# Patient Record
Sex: Female | Born: 1990 | Race: White | Hispanic: No | Marital: Single | Smoking: Never smoker
Health system: Southern US, Community
[De-identification: ages and names within clinical notes are randomized; demographics above are authoritative.]

## PROBLEM LIST (undated history)

## (undated) DIAGNOSIS — J353 Hypertrophy of tonsils with hypertrophy of adenoids: Secondary | ICD-10-CM

## (undated) DIAGNOSIS — T7840XA Allergy, unspecified, initial encounter: Secondary | ICD-10-CM

## (undated) DIAGNOSIS — Z9889 Other specified postprocedural states: Secondary | ICD-10-CM

## (undated) HISTORY — DX: Allergy, unspecified, initial encounter: T78.40XA

---
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
## 2012-04-05 ENCOUNTER — Encounter (HOSPITAL_COMMUNITY): Payer: Self-pay | Admitting: Emergency Medicine

## 2012-04-05 ENCOUNTER — Emergency Department (HOSPITAL_COMMUNITY)
Admission: EM | Admit: 2012-04-05 | Discharge: 2012-04-05 | Disposition: A | Discharge status: Home / Self Care | Payer: 59 | Attending: Emergency Medicine | Admitting: Emergency Medicine

## 2012-04-05 ENCOUNTER — Emergency Department (HOSPITAL_COMMUNITY): Payer: 59

## 2012-04-05 DIAGNOSIS — R112 Nausea with vomiting, unspecified: Secondary | ICD-10-CM | POA: Insufficient documentation

## 2012-04-05 DIAGNOSIS — J189 Pneumonia, unspecified organism: Secondary | ICD-10-CM

## 2012-04-05 DIAGNOSIS — R509 Fever, unspecified: Secondary | ICD-10-CM | POA: Insufficient documentation

## 2012-04-05 DIAGNOSIS — Z8639 Personal history of other endocrine, nutritional and metabolic disease: Secondary | ICD-10-CM | POA: Insufficient documentation

## 2012-04-05 DIAGNOSIS — IMO0001 Reserved for inherently not codable concepts without codable children: Secondary | ICD-10-CM | POA: Insufficient documentation

## 2012-04-05 DIAGNOSIS — R300 Dysuria: Secondary | ICD-10-CM | POA: Insufficient documentation

## 2012-04-05 DIAGNOSIS — Z8739 Personal history of other diseases of the musculoskeletal system and connective tissue: Secondary | ICD-10-CM | POA: Insufficient documentation

## 2012-04-05 DIAGNOSIS — R5381 Other malaise: Secondary | ICD-10-CM | POA: Insufficient documentation

## 2012-04-05 DIAGNOSIS — M549 Dorsalgia, unspecified: Secondary | ICD-10-CM | POA: Insufficient documentation

## 2012-04-05 DIAGNOSIS — R059 Cough, unspecified: Secondary | ICD-10-CM | POA: Insufficient documentation

## 2012-04-05 DIAGNOSIS — J159 Unspecified bacterial pneumonia: Secondary | ICD-10-CM | POA: Insufficient documentation

## 2012-04-05 DIAGNOSIS — R51 Headache: Secondary | ICD-10-CM | POA: Insufficient documentation

## 2012-04-05 DIAGNOSIS — R0602 Shortness of breath: Secondary | ICD-10-CM | POA: Insufficient documentation

## 2012-04-05 DIAGNOSIS — R109 Unspecified abdominal pain: Secondary | ICD-10-CM | POA: Insufficient documentation

## 2012-04-05 DIAGNOSIS — R05 Cough: Secondary | ICD-10-CM | POA: Insufficient documentation

## 2012-04-05 DIAGNOSIS — N390 Urinary tract infection, site not specified: Secondary | ICD-10-CM

## 2012-04-05 DIAGNOSIS — Z862 Personal history of diseases of the blood and blood-forming organs and certain disorders involving the immune mechanism: Secondary | ICD-10-CM | POA: Insufficient documentation

## 2012-04-05 LAB — CBC WITH DIFFERENTIAL/PLATELET
Hemoglobin: 14.5 g/dL (ref 12.0–15.0)
Lymphs Abs: 1.1 10*3/uL (ref 0.7–4.0)
Monocytes Relative: 11 % (ref 3–12)
Neutro Abs: 6.5 10*3/uL (ref 1.7–7.7)
Neutrophils Relative %: 76 % (ref 43–77)
RBC: 4.94 MIL/uL (ref 3.87–5.11)

## 2012-04-05 LAB — BASIC METABOLIC PANEL
BUN: 8 mg/dL (ref 6–23)
CO2: 22 mEq/L (ref 19–32)
Chloride: 96 mEq/L (ref 96–112)
Glucose, Bld: 90 mg/dL (ref 70–99)
Potassium: 3.4 mEq/L — ABNORMAL LOW (ref 3.5–5.1)

## 2012-04-05 LAB — URINALYSIS, ROUTINE W REFLEX MICROSCOPIC
Bilirubin Urine: NEGATIVE
Nitrite: NEGATIVE
Specific Gravity, Urine: 1.024 (ref 1.005–1.030)
pH: 7 (ref 5.0–8.0)

## 2012-04-05 LAB — URINE MICROSCOPIC-ADD ON

## 2012-04-05 LAB — RAPID STREP SCREEN (MED CTR MEBANE ONLY): Streptococcus, Group A Screen (Direct): NEGATIVE

## 2012-04-05 LAB — POCT PREGNANCY, URINE: Preg Test, Ur: NEGATIVE

## 2012-04-05 MED ORDER — AZITHROMYCIN 250 MG PO TABS
500.0000 mg | ORAL_TABLET | Freq: Once | ORAL | Status: AC
  Administered 2012-04-05: 500 mg via ORAL
  Filled 2012-04-05: qty 2

## 2012-04-05 MED ORDER — MORPHINE SULFATE 4 MG/ML IJ SOLN
4.0000 mg | Freq: Once | INTRAMUSCULAR | Status: AC
  Administered 2012-04-05: 4 mg via INTRAVENOUS
  Filled 2012-04-05: qty 1

## 2012-04-05 MED ORDER — ONDANSETRON HCL 4 MG/2ML IJ SOLN
4.0000 mg | Freq: Once | INTRAMUSCULAR | Status: AC
  Administered 2012-04-05: 4 mg via INTRAVENOUS
  Filled 2012-04-05: qty 2

## 2012-04-05 MED ORDER — SODIUM CHLORIDE 0.9 % IV BOLUS (SEPSIS)
1000.0000 mL | Freq: Once | INTRAVENOUS | Status: AC
  Administered 2012-04-05: 1000 mL via INTRAVENOUS

## 2012-04-05 MED ORDER — AZITHROMYCIN 250 MG PO TABS: ORAL_TABLET | ORAL | Status: DC

## 2012-04-05 MED ORDER — ACETAMINOPHEN 325 MG PO TABS
650.0000 mg | ORAL_TABLET | Freq: Once | ORAL | Status: AC
  Administered 2012-04-05: 650 mg via ORAL
  Filled 2012-04-05: qty 2

## 2012-04-05 MED ORDER — DEXTROSE 5 % IV SOLN
1.0000 g | Freq: Once | INTRAVENOUS | Status: AC
  Administered 2012-04-05: 1 g via INTRAVENOUS
  Filled 2012-04-05: qty 10

## 2012-04-05 MED ORDER — SULFAMETHOXAZOLE-TRIMETHOPRIM 800-160 MG PO TABS: 1.0000 | ORAL_TABLET | Freq: Two times a day (BID) | ORAL | Status: DC

## 2012-04-05 NOTE — ED Notes (Signed)
Pt more relaxed, breathing controlled. RR 20. 100% RA. Pt is a x 4. C/o neck, upper back pain. Pt has chills. Pt has mask in place. EDP made aware of patient.

## 2012-04-05 NOTE — ED Notes (Signed)
Pt here hyperventilating c/o neck stiffness and back pain; pt sts hx of similar 1 year ago that was ruled out meningitis; pt sts some SOB and tachypnea

## 2012-04-05 NOTE — ED Provider Notes (Signed)
History     CSN: [id number]  Arrival date & time 04/05/12  1804   First MD Initiated Contact with Patient 04/05/12 1842      Chief Complaint  Patient presents with  . Neck Pain  . Back Pain    Patient is a 21 y.o. female presenting with fever. The history is provided by the patient and a relative.  Fever Primary symptoms of the febrile illness include fever, fatigue, headaches, cough, shortness of breath, abdominal pain, nausea, vomiting, dysuria and myalgias. Primary symptoms do not include visual change, wheezing, diarrhea, altered mental status or rash. The current episode started today. This is a new problem. The problem has been rapidly worsening.  The headache is not associated with visual change.  pt presents with onset of headache, neck pain, back pain, cough, sore throat, lower abdominal pain, muscle aches earlier today.  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 Tonsillar and adenoid hypertrophy     Dr Lazarus Salines  . S/P arthroscopy of knee     Past Surgical History  Procedure Date  . Knee arthroscopy w/ acl reconstruction 2009    Family History  Problem Relation Age of Onset  . Hyperlipidemia Mother     History  Substance Use Topics  . Smoking status: Never Smoker   . Smokeless tobacco: Never Used  . Alcohol Use: No    OB History    Grav Para Term Preterm Abortions TAB SAB Ect Mult Living                  Review of Systems  Constitutional: Positive for fever and fatigue.  Respiratory: Positive for cough and shortness of breath. Negative for wheezing.   Gastrointestinal: Positive for nausea, vomiting and abdominal pain. Negative for diarrhea.  Genitourinary: Positive for dysuria. Negative for vaginal bleeding  and vaginal discharge.  Musculoskeletal: Positive for myalgias.  Skin: Negative for rash.  Neurological: Positive for headaches.  Psychiatric/Behavioral: Negative for altered mental status.  All other systems reviewed and are negative.    Allergies  Review of patient's allergies indicates no known allergies.  Home Medications   Current Outpatient Rx  Name Route Sig Dispense Refill  . PRESCRIPTION MEDICATION Oral Take 1 tablet by mouth every evening. "birth control med" generic for lo egestrol      BP 120/76  Pulse 106  Temp 103.1 F (39.5 C) (Rectal)  Resp 20  SpO2 100%  Physical Exam CONSTITUTIONAL: Well developed/well nourished HEAD AND FACE: Normocephalic/atraumatic EYES: EOMI/PERRL ENMT: Mucous membranes moist, uvula midline, voice normal NECK: supple no meningeal signs SPINE:entire spine nontender, No bruising/crepitance/stepoffs noted to spine CV: S1/S2 noted, no murmurs/rubs/gallops noted LUNGS: Lungs are clear to auscultation bilaterally, no apparent distress ABDOMEN: soft, nontender, no rebound or guarding GU:no cva tenderness NEURO: Pt is awake/alert, moves all extremitiesx4, GCS 15.  Normal mentation noted EXTREMITIES: pulses normal, full ROM SKIN: warm, color normal, no rash, no petechiae PSYCH: no abnormalities of mood noted    ED Course  Procedures   Labs Reviewed  CBC WITH DIFFERENTIAL - Abnormal; Notable for the following:    Platelets 148 (*)     All other components within normal limits  BASIC METABOLIC PANEL  URINALYSIS, ROUTINE W REFLEX MICROSCOPIC  CK  RAPID STREP SCREEN   Dg Chest 2 View  04/05/2012  *RADIOLOGY REPORT*  Clinical Data: Shortness of breath.  Chest pain.  Cough.  CHEST - 2 VIEW  Comparison: Two-view chest 03/08/2005.  Findings: Ill-defined posterior right upper lobe airspace disease is evident.  Mild central airway thickening is present.  No other focal airspace disease is present.  Heart size is normal.  The visualized  soft tissues and bony thorax are unremarkable.  IMPRESSION:  1.  Ill-defined right lower lobe airspace disease may represent early bronchopneumonia. 2.  Mild central airway thickening suggests an underlying viral process as well.   Original Report Authenticated By: Marin Roberts, M.D.      7:59 PM Pt had illness last year that required LP (negative per records) and admission with ID consult. Felt it was viral syndrome and did not have meningitis Pt currently stable.  She reports neck pain but her neck is supple.   Will follow closely. 9:20 PM Pt improved Early pneumonia noted (pt reported cough) Also has uti (she reports dysuria but no vag discharge) She is awake/alert, smiling, no distress. Doubt meningitis.  She is stable for outpatient management  MDM  Nursing notes including past medical history and social history reviewed and considered in documentation Labs/vital reviewed and considered Previous records reviewed and considered - admission in 2012 reviewed xrays reviewed and considered         Joya Gaskins, MD 04/05/12 2122

## 2012-04-05 NOTE — ED Notes (Addendum)
Pt noted to be hyperventilating, RR 26. Pt c/o neck, back pain sudden onset this afternoon after waking up from nap. Pt lives in dorms at [employer]. Had previous episode of this last year.

## 2012-04-07 LAB — URINE CULTURE

## 2012-04-08 NOTE — ED Notes (Signed)
+  Urine. Patient treated with Septra DS. Sensitive to same. Per protocol MD. °

## 2013-07-13 ENCOUNTER — Ambulatory Visit (INDEPENDENT_AMBULATORY_CARE_PROVIDER_SITE_OTHER): Payer: 59 | Admitting: Physician Assistant

## 2013-07-13 VITALS — BP 104/70 | HR 86 | Temp 98.1°F | Resp 18 | Ht 65.0 in | Wt 135.6 lb

## 2013-07-13 DIAGNOSIS — H6690 Otitis media, unspecified, unspecified ear: Secondary | ICD-10-CM

## 2013-07-13 DIAGNOSIS — H6692 Otitis media, unspecified, left ear: Secondary | ICD-10-CM

## 2013-07-13 DIAGNOSIS — Z2089 Contact with and (suspected) exposure to other communicable diseases: Secondary | ICD-10-CM

## 2013-07-13 DIAGNOSIS — J029 Acute pharyngitis, unspecified: Secondary | ICD-10-CM

## 2013-07-13 DIAGNOSIS — Z20818 Contact with and (suspected) exposure to other bacterial communicable diseases: Secondary | ICD-10-CM

## 2013-07-13 MED ORDER — AMOXICILLIN 500 MG PO TABS: 1000.0000 mg | ORAL_TABLET | Freq: Three times a day (TID) | ORAL | Status: DC

## 2013-07-13 NOTE — Patient Instructions (Signed)
Begin taking the amoxicillin as directed.  Be sure to finish the full course as directed even when you begin feeling better  Use Tylenol or Advil for pain relief if needed  If any symptoms are worsening or not improving, please let us know  The amoxicillin will also cover you for strep as well, should you have been exposed   Otitis Media, Adult Otitis media is redness, soreness, and swelling (inflammation) of the middle ear. Otitis media may be caused by allergies or, most commonly, by infection. Often it occurs as a complication of the common cold. SIGNS AND SYMPTOMS Symptoms of otitis media may include:  Earache.  Fever.  Ringing in your ear.  Headache.  Leakage of fluid from the ear. DIAGNOSIS To diagnose otitis media, your health care provider will examine your ear with an otoscope. This is an instrument that allows your health care provider to see into your ear in order to examine your eardrum. Your health care provider also will ask you questions about your symptoms. TREATMENT  Typically, otitis media resolves on its own within 3 5 days. Your health care provider may prescribe medicine to ease your symptoms of pain. If otitis media does not resolve within 5 days or is recurrent, your health care provider may prescribe antibiotic medicines if he or she suspects that a bacterial infection is the cause. HOME CARE INSTRUCTIONS   Take your medicine as directed until it is gone, even if you feel better after the first few days.  Only take over-the-counter or prescription medicines for pain, discomfort, or fever as directed by your health care provider.  Follow up with your health care provider as directed. SEEK MEDICAL CARE IF:  You have otitis media only in one ear or bleeding from your nose or both.  You notice a lump on your neck.  You are not getting better in 3 5 days.  You feel worse instead of better. SEEK IMMEDIATE MEDICAL CARE IF:   You have pain that is not  controlled with medicine.  You have swelling, redness, or pain around your ear or stiffness in your neck.  You notice that part of your face is paralyzed.  You notice that the bone behind your ear (mastoid) is tender when you touch it. MAKE SURE YOU:   Understand these instructions.  Will watch your condition.  Will get help right away if you are not doing well or get worse. Document Released: 02/26/2004 Document Revised: 03/13/2013 Document Reviewed: 12/18/2012 [facility] Patient Information 2014 [place], [place].

## 2013-07-13 NOTE — Progress Notes (Signed)
   Subjective:    Patient ID: Stephanie KaufmannAdrianne C [name], female    DOB: Jan 04, 1991, 23 y.o.   MRN: [id number]  HPI   Stephanie [name] is a very pleasant 23 yr old female here with concern for illness.  Painful left ear, can't hear out of left ear.  No drainage or FB.  Right ear feels ok.  Previous URI symptoms.  Many sick contacts at school in her dorm including strep contacts.  Throat has been hurting for last 2 days.  No fever.  She did take the flu shot this season.     Review of Systems  Constitutional: Negative for fever and chills.  HENT: Positive for ear pain and sore throat.   Respiratory: Negative for cough, shortness of breath and wheezing.   Cardiovascular: Negative.   Gastrointestinal: Negative.   Musculoskeletal: Negative.   Skin: Negative.        Objective:   Physical Exam  Vitals reviewed. Constitutional: She is oriented to person, place, and time. She appears well-developed and well-nourished. No distress.  HENT:  Head: Normocephalic and atraumatic.  Right Ear: Tympanic membrane and ear canal normal.  Left Ear: Ear canal normal. Tympanic membrane is erythematous and bulging.  Mouth/Throat: Uvula is midline and mucous membranes are normal. Posterior oropharyngeal erythema present.  Eyes: Conjunctivae are normal. No scleral icterus.  Neck: Neck supple.  Cardiovascular: Normal rate, regular rhythm and normal heart sounds.   Pulmonary/Chest: Effort normal and breath sounds normal. She has no wheezes. She has no rales.  Lymphadenopathy:    She has no cervical adenopathy.  Neurological: She is alert and oriented to person, place, and time.  Skin: Skin is warm and dry.  Psychiatric: She has a normal mood and affect. Her behavior is normal.       Assessment & Plan:  Otitis media, left - Plan: amoxicillin (AMOXIL) 500 MG tablet  Acute pharyngitis  Exposure to strep throat   Stephanie [name] is a very pleasant 23 yr old female with left otitis media.  Will treat with  amoxicillin 1g TID x 10 days.  Will defer strep testing as amox will cover that.  Discussed this with pt who understands and agrees.  Pt to call or RTC if worsening or not improving.  Stephanie [name] MHS, PA-C [facility] 2/7/20156:22 PM

## 2013-10-16 ENCOUNTER — Ambulatory Visit (INDEPENDENT_AMBULATORY_CARE_PROVIDER_SITE_OTHER): Payer: 59 | Admitting: Physician Assistant

## 2013-10-16 VITALS — BP 106/74 | HR 78 | Temp 98.5°F | Resp 16 | Ht 63.0 in | Wt 135.0 lb

## 2013-10-16 DIAGNOSIS — Z0000 Encounter for general adult medical examination without abnormal findings: Secondary | ICD-10-CM

## 2013-10-16 DIAGNOSIS — Z3041 Encounter for surveillance of contraceptive pills: Secondary | ICD-10-CM

## 2013-10-16 LAB — POCT URINALYSIS DIPSTICK
BILIRUBIN UA: NEGATIVE
Glucose, UA: NEGATIVE
KETONES UA: NEGATIVE
Nitrite, UA: NEGATIVE
Protein, UA: NEGATIVE
SPEC GRAV UA: 1.015
Urobilinogen, UA: 0.2
pH, UA: 6

## 2013-10-16 MED ORDER — NORETHIN ACE-ETH ESTRAD-FE 1-20 MG-MCG PO TABS: 1.0000 | ORAL_TABLET | Freq: Every day | ORAL | Status: DC

## 2013-10-16 NOTE — Progress Notes (Signed)
I was directly involved with the patient's care and agree with the physical, diagnosis and treatment plan.  

## 2013-10-16 NOTE — Progress Notes (Signed)
Subjective:    Patient ID: Stephanie KaufmannAdrianne C [name], female    DOB: 05/23/1991, 23 y.o.   MRN: [id number]  HPI   22y.o  female presents for annual physical exam.  Pt leaving to work in [place] this summer with a Church group.  She will be in [place] from 6/10 through 9/1.  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  Only prescription medicine is OCP Gildess.  Pt does not have enough to get through August and is unable to make an appointment with GYN before leaving the country.  No other problems or concerns.  Social history reviewed and updated  Family history reviewed and updated   Review of Systems  Constitutional: Negative.   HENT: Negative.   Eyes: Negative.   Respiratory: Negative.   Cardiovascular: Negative.   Gastrointestinal: Negative.   Endocrine: Negative.   Genitourinary: Negative.   Musculoskeletal: Negative.   Skin: Negative.   Allergic/Immunologic: Negative.   Neurological: Negative.   Hematological: Negative.        Objective:   Physical Exam  Constitutional: She is oriented to person, place, and time. She appears well-developed and well-nourished. No distress.  BP 106/74  Pulse 78  Temp(Src) 98.5 F (36.9 C)  Resp 16  Ht 5\' 3"  (1.6 m)  Wt 135 lb (61.236 kg)  BMI 23.92 kg/m2  SpO2 100%   HENT:  Head: Normocephalic.  Right Ear: External ear normal.  Left Ear: External ear normal.  Nose: Nose normal.  Mouth/Throat: Oropharynx is clear and moist. No oropharyngeal exudate.  Eyes: Conjunctivae and EOM are normal. Pupils are equal, round, and reactive to light.  Neck: Normal range of motion. No thyromegaly present.  Cardiovascular: Normal rate, regular rhythm, normal heart sounds and intact distal pulses.  Exam reveals no gallop and no friction rub.   No murmur heard. Pulmonary/Chest: Effort normal and breath sounds normal. No respiratory distress. She has no wheezes. She exhibits no tenderness.  Abdominal: Soft. Bowel sounds are normal. She exhibits  no distension and no mass. There is no tenderness. There is no guarding.  Musculoskeletal: Normal range of motion. She exhibits no edema.  Lymphadenopathy:    She has no cervical adenopathy.  Neurological: She is alert and oriented to person, place, and time. She has normal strength and normal reflexes. She displays normal reflexes. No cranial nerve deficit or sensory deficit. She displays a negative Romberg sign. Coordination and gait normal.  Skin: Skin is warm and dry. No rash noted.  Psychiatric: She has a normal mood and affect. Her behavior is normal.    Results for orders placed in visit on 10/16/13  POCT URINALYSIS DIPSTICK      Result Value Ref Range   Color, UA yellow     Clarity, UA clear     Glucose, UA neg     Bilirubin, UA neg     Ketones, UA neg     Spec Grav, UA 1.015     Blood, UA trace     pH, UA 6.0     Protein, UA neg     Urobilinogen, UA 0.2     Nitrite, UA neg     Leukocytes, UA moderate (2+)           Assessment & Plan:   1. Annual physical exam - POCT urinalysis dipstick  2. Family planning, BCP (birth control pills) maintenance Refilled medication.  Pt will schedule appointment with GYN for pap smear upon returning from [place]. - norethindrone-ethinyl estradiol (GILDESS  FE 1/20) 1-20 MG-MCG tablet; Take 1 tablet by mouth daily.  Dispense: 1 Package; Refill: 3

## 2013-11-14 ENCOUNTER — Ambulatory Visit (INDEPENDENT_AMBULATORY_CARE_PROVIDER_SITE_OTHER): Payer: 59 | Admitting: Physician Assistant

## 2013-11-14 ENCOUNTER — Telehealth: Payer: Self-pay

## 2013-11-14 VITALS — BP 104/72 | HR 58 | Temp 99.3°F | Resp 16 | Ht 65.0 in | Wt 136.4 lb

## 2013-11-14 DIAGNOSIS — H9202 Otalgia, left ear: Secondary | ICD-10-CM

## 2013-11-14 DIAGNOSIS — H6980 Other specified disorders of Eustachian tube, unspecified ear: Secondary | ICD-10-CM

## 2013-11-14 DIAGNOSIS — H6990 Unspecified Eustachian tube disorder, unspecified ear: Secondary | ICD-10-CM

## 2013-11-14 DIAGNOSIS — H9209 Otalgia, unspecified ear: Secondary | ICD-10-CM

## 2013-11-14 MED ORDER — PSEUDOEPHEDRINE-GUAIFENESIN ER 120-1200 MG PO TB12: 1.0000 | ORAL_TABLET | Freq: Two times a day (BID) | ORAL | Status: DC

## 2013-11-14 MED ORDER — MOMETASONE FUROATE 50 MCG/ACT NA SUSP: 2.0000 | Freq: Every day | NASAL | Status: DC

## 2013-11-14 MED ORDER — FLUTICASONE PROPIONATE 50 MCG/ACT NA SUSP: 2.0000 | Freq: Every day | NASAL | Status: DC

## 2013-11-14 NOTE — Telephone Encounter (Signed)
Stephanie [name] faxed that nasonex is not covered by ins, but flonase is. OK to change, or do you want me to do PA (if so,has pt tried/failed Flonase previously)? Pended.

## 2013-11-14 NOTE — Progress Notes (Signed)
   Subjective:    Patient ID: Stephanie [name], female    DOB: [birth year], 23 y.o.   MRN: [id number]  HPI Pt presents to clinic with left ear pain for the couple of days.  She has had no recent colds but has had some congestion from her seasonal allergies.  She is flying to [place] in 3 days and this feels like she has an ear infection again.    Review of Systems  Constitutional: Negative for fever and chills.  HENT: Positive for congestion and ear pain. Negative for ear discharge and hearing loss.   Allergic/Immunologic: Positive for environmental allergies.       Objective:   Physical Exam  Vitals reviewed. Constitutional: She is oriented to person, place, and time. She appears well-developed and well-nourished.  HENT:  Head: Normocephalic and atraumatic.  Right Ear: Hearing, tympanic membrane, external ear and ear canal normal.  Left Ear: Hearing, tympanic membrane, external ear and ear canal normal.  Nose: Mucosal edema (pale) present.  Mouth/Throat: Uvula is midline, oropharynx is clear and moist and mucous membranes are normal.  Eyes: Conjunctivae are normal.  Cardiovascular: Normal rate, regular rhythm and normal heart sounds.   No murmur heard. Pulmonary/Chest: Effort normal and breath sounds normal. She has no wheezes.  Neurological: She is alert and oriented to person, place, and time.  Skin: Skin is warm and dry.  Psychiatric: She has a normal mood and affect. Her behavior is normal. Judgment and thought content normal.       Assessment & Plan:  ETD (eustachian tube dysfunction) - Plan: mometasone (NASONEX) 50 MCG/ACT nasal spray, Pseudoephedrine-Guaifenesin (MUCINEX D) [phone] MG TB12  Pt has ETD and due to upcoming air travel we will be aggressive with her symptomatic treatment.  Benny Lennert PA-C  [facility] 11/14/2013 11:47 AM

## 2013-11-14 NOTE — Patient Instructions (Signed)
Afrin nose spray prior to take off for your flights. Motrin 200mg  take 4 pills 3x/day for pain. Can take additional Tylenol

## 2013-11-14 NOTE — Telephone Encounter (Signed)
It is absolutely ok to change to Flonase.

## 2014-03-21 ENCOUNTER — Other Ambulatory Visit: Payer: Self-pay | Admitting: Physician Assistant

## 2014-05-25 IMAGING — CR DG CHEST 2V
2 series · 2 of 2 positions shown · non-contrast
Comparison: Two-view chest 03/08/2005.

CLINICAL DATA: Shortness of breath.  Chest pain.  Cough.

CHEST - 2 VIEW

[w chest pa]
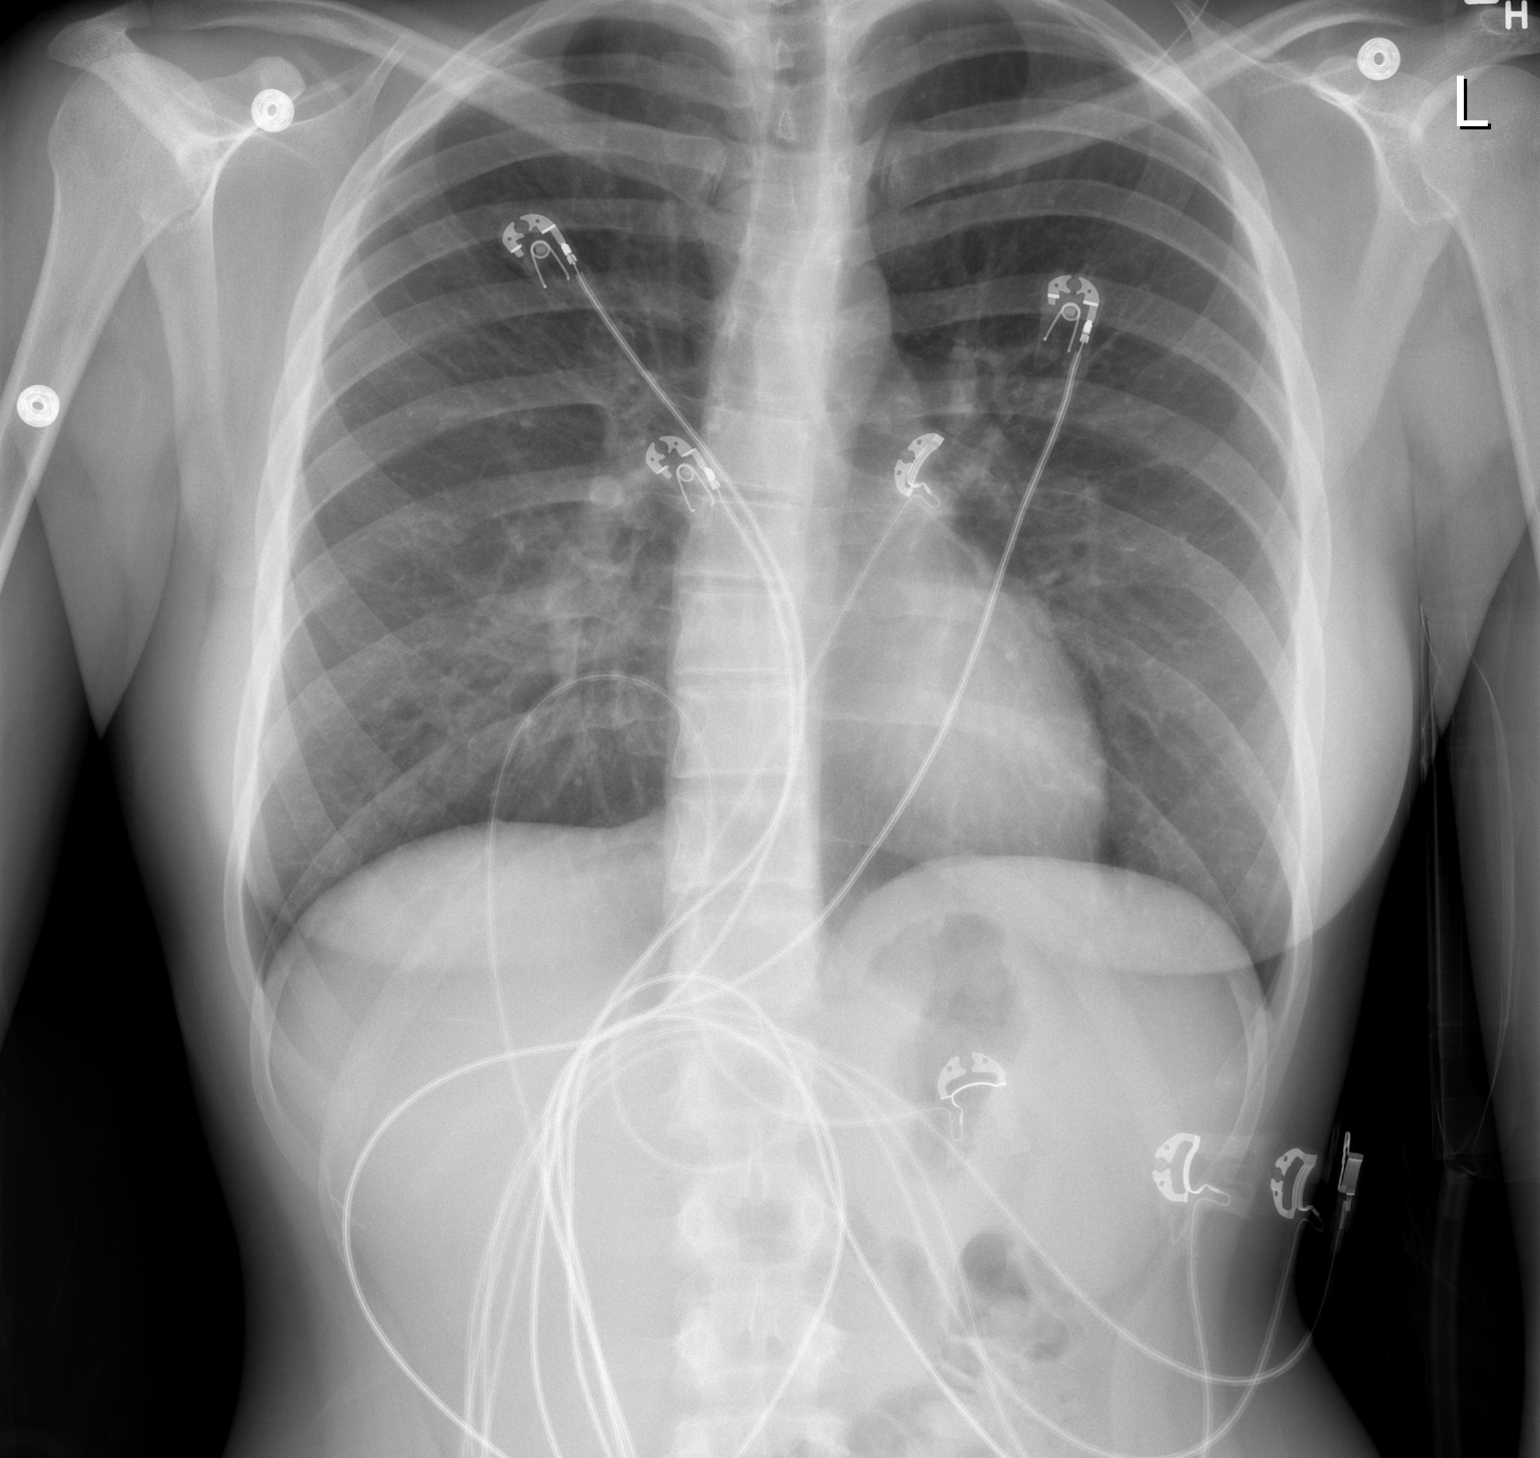

[w chest lat]
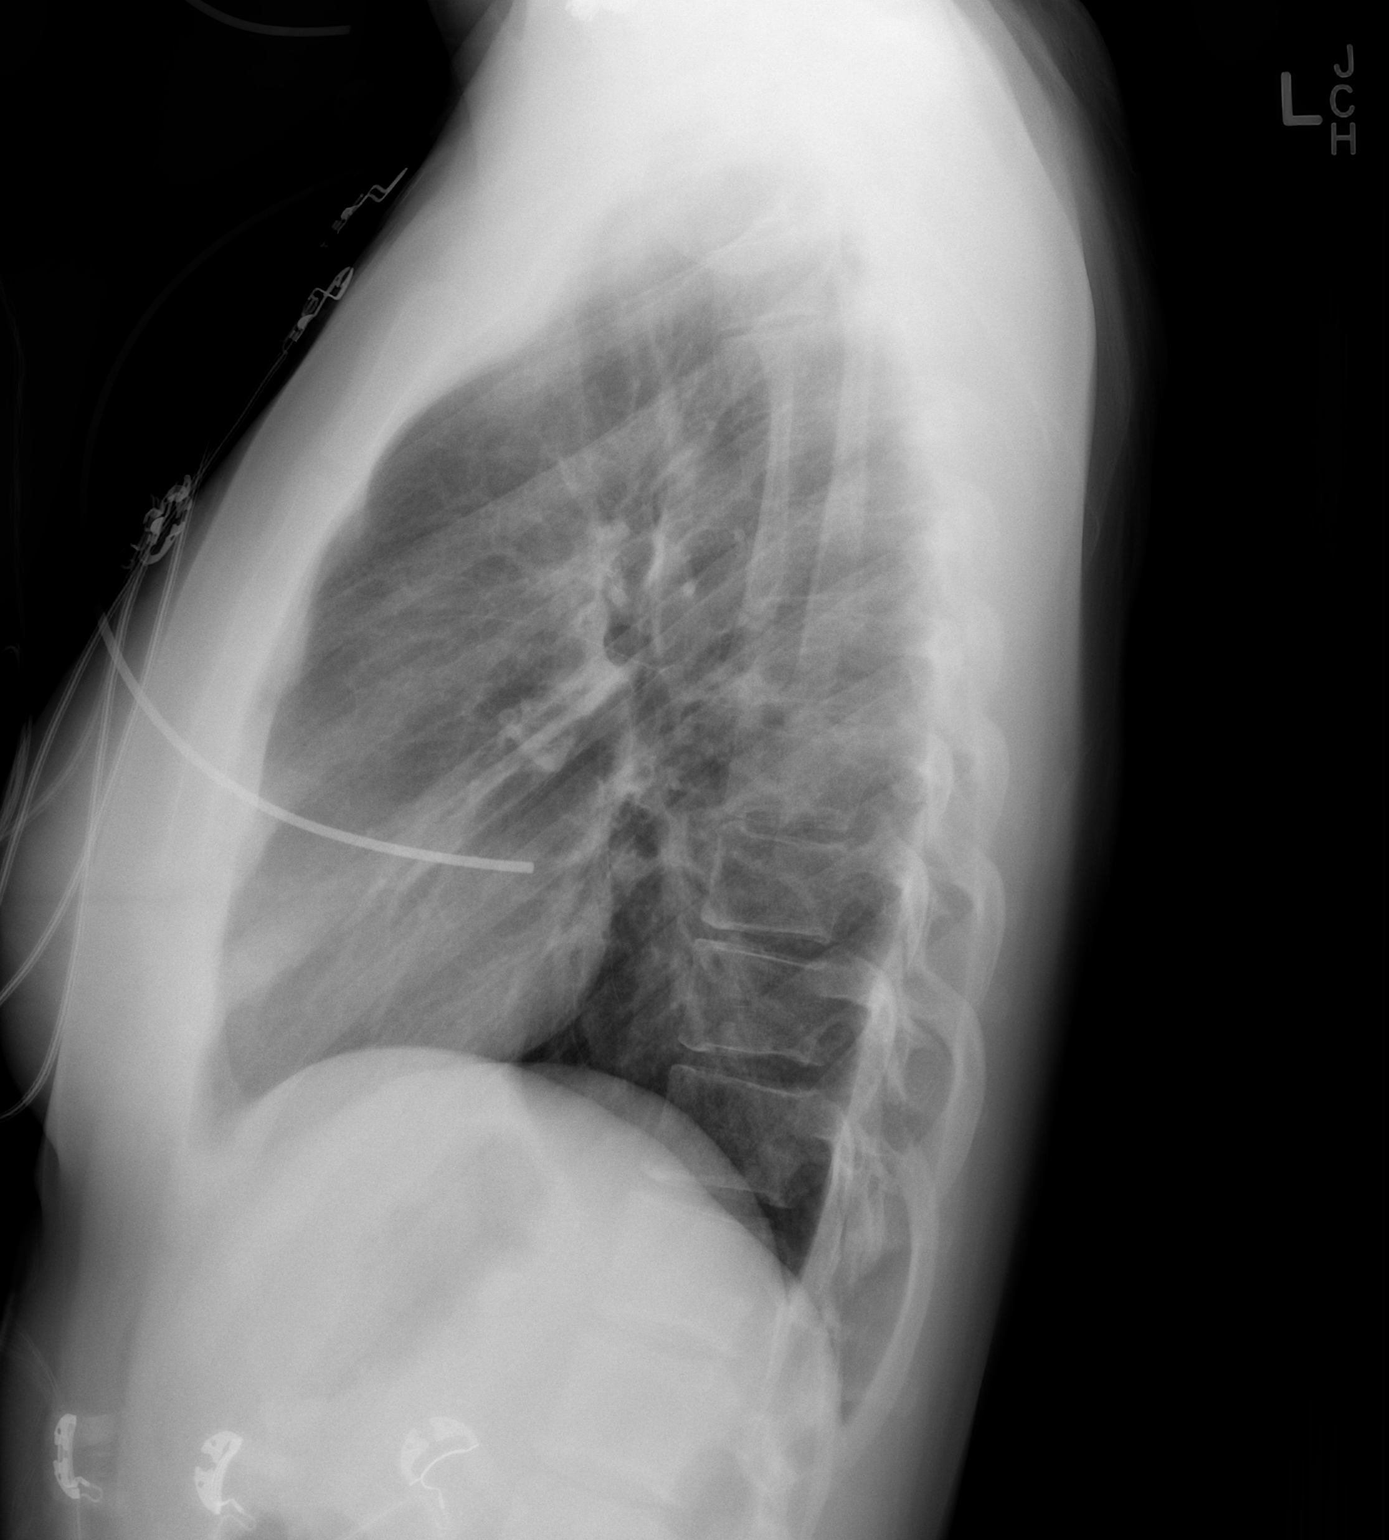

[2 of 2 positions shown; findings below may reference images not displayed]

FINDINGS: Ill-defined posterior right upper lobe airspace disease
is evident.  Mild central airway thickening is present.  No other
focal airspace disease is present.  Heart size is normal.  The
visualized soft tissues and bony thorax are unremarkable.
IMPRESSION: 1.  Ill-defined right lower lobe airspace disease may represent
early bronchopneumonia.
2.  Mild central airway thickening suggests an underlying viral
process as well.

## 2014-08-24 ENCOUNTER — Other Ambulatory Visit: Payer: Self-pay | Admitting: Physician Assistant

## 2014-10-22 ENCOUNTER — Ambulatory Visit (INDEPENDENT_AMBULATORY_CARE_PROVIDER_SITE_OTHER): Payer: 59 | Admitting: Urgent Care

## 2014-10-22 VITALS — BP 118/64 | HR 62 | Temp 98.2°F | Resp 16 | Ht 64.0 in | Wt 136.6 lb

## 2014-10-22 DIAGNOSIS — Z0000 Encounter for general adult medical examination without abnormal findings: Secondary | ICD-10-CM

## 2014-10-22 DIAGNOSIS — Z23 Encounter for immunization: Secondary | ICD-10-CM | POA: Diagnosis not present

## 2014-10-22 DIAGNOSIS — J302 Other seasonal allergic rhinitis: Secondary | ICD-10-CM | POA: Diagnosis not present

## 2014-10-22 LAB — COMPREHENSIVE METABOLIC PANEL
ALT: 11 U/L (ref 0–35)
AST: 17 U/L (ref 0–37)
Albumin: 4.1 g/dL (ref 3.5–5.2)
Alkaline Phosphatase: 72 U/L (ref 39–117)
BUN: 9 mg/dL (ref 6–23)
CALCIUM: 9.3 mg/dL (ref 8.4–10.5)
CHLORIDE: 100 meq/L (ref 96–112)
CO2: 26 meq/L (ref 19–32)
CREATININE: 0.71 mg/dL (ref 0.50–1.10)
GLUCOSE: 103 mg/dL — AB (ref 70–99)
Potassium: 4.2 mEq/L (ref 3.5–5.3)
Sodium: 136 mEq/L (ref 135–145)
Total Bilirubin: 0.3 mg/dL (ref 0.2–1.2)
Total Protein: 7.3 g/dL (ref 6.0–8.3)

## 2014-10-22 LAB — LIPID PANEL
CHOL/HDL RATIO: 3.9 ratio
Cholesterol: 209 mg/dL — ABNORMAL HIGH (ref 0–200)
HDL: 54 mg/dL (ref >=46)
LDL CALC: 125 mg/dL — AB (ref 0–99)
Triglycerides: 149 mg/dL (ref <150)
VLDL: 30 mg/dL (ref 0–40)

## 2014-10-22 LAB — CBC
HCT: 38.9 % (ref 36.0–46.0)
HEMOGLOBIN: 13.2 g/dL (ref 12.0–15.0)
MCH: 28.8 pg (ref 26.0–34.0)
MCHC: 33.9 g/dL (ref 30.0–36.0)
MCV: 84.7 fL (ref 78.0–100.0)
MPV: 10.5 fL (ref 8.6–12.4)
Platelets: 313 10*3/uL (ref 150–400)
RBC: 4.59 MIL/uL (ref 3.87–5.11)
RDW: 14 % (ref 11.5–15.5)
WBC: 13.8 10*3/uL — AB (ref 4.0–10.5)

## 2014-10-22 LAB — TSH: TSH: 0.895 u[IU]/mL (ref 0.350–4.500)

## 2014-10-22 NOTE — Progress Notes (Signed)
MRN: [id number]  Subjective:   [name] is a 24 y.o. female presenting for annual physical exam.  Medical care team includes: PCP: Gwen PoundsUSSO,JOHN M, MD Vision: Wears glasses, gets eye exams annually, [facility] Dental: Dental cleanings every year OB/[facility]N: [facility], Dr. Henderson CloudHorvath, pap smear done 08/2014, follow up in 11/2014 Specialists: None.   Stephanie [name] does not have any active problems for her problem list.  Stephanie [name] is a recent graduate from [employer] 10/2014. 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 Currently, she is single, eats healthily, plays soccer. She does not smoke cigarettes or drink alcohol.   Sinus congestion - patient reports intermittent sinus congestion, rhinorrhea, scratchy throat. She has a history of seasonal allergies and has used Flonase, Zyrtec in the past but is not consistent with her allergy medication. She has not taken it recently. She has also had itching, dry skin around her nose, she suspects is due to her blowing her nose frequently with Kleenex. ROS as below.  Stephanie [name] has a current medication list which includes the following prescription(s): gildess fe 1/20. She has No Known Allergies.  Stephanie [name]  has a past medical history of Tonsillar and adenoid hypertrophy and S/P arthroscopy of knee. Also  has past surgical history that includes Knee arthroscopy w/ ACL reconstruction (2009).  Stephanie [name]'s family history includes Cancer in her maternal grandfather; Hyperlipidemia in her mother.  Immunizations: last tetanus unknown.  Review of Systems  Constitutional: Negative for fever, chills, weight loss and malaise/fatigue.  HENT: Positive for congestion. Negative for ear discharge, ear pain, hearing loss, nosebleeds and tinnitus.   Eyes: Negative for blurred vision, double vision, photophobia, pain, discharge and redness.  Respiratory: Negative for cough, hemoptysis, shortness of breath,  wheezing and stridor.   Cardiovascular: Negative for chest pain, palpitations, claudication and leg swelling.  Gastrointestinal: Negative for heartburn, nausea, vomiting, abdominal pain, diarrhea, constipation, blood in stool and melena.  Genitourinary: Negative for dysuria, urgency, frequency, hematuria and flank pain.  Musculoskeletal: Negative for myalgias, back pain and joint pain.  Skin: Positive for itching (around her nose, see HPI). Negative for rash.  Neurological: Negative for dizziness, seizures, weakness and headaches.  Endo/Heme/Allergies: Negative for polydipsia.  Psychiatric/Behavioral: Negative for depression. The patient is not nervous/anxious and does not have insomnia.    Objective:   Vitals: BP 118/64 mmHg  Pulse 62  Temp(Src) 98.2 F (36.8 C) (Oral)  Resp 16  Ht 5\' 4"  (1.626 m)  Wt 136 lb 9.6 oz (61.961 kg)  BMI 23.44 kg/m2  SpO2 97%  LMP 09/03/2014  Physical Exam  Constitutional: She is oriented to person, place, and time. She appears well-developed and well-nourished.  HENT:  TM's intact bilaterally, no effusions or erythema. Nasal turbinates pink and moist. No sinus tenderness. Postnasal drip present, without oropharyngeal exudates, erythema or abscesses.  Eyes: Conjunctivae and EOM are normal. Pupils are equal, round, and reactive to light. Right eye exhibits no discharge. Left eye exhibits no discharge. No scleral icterus.  Neck: Neck supple. No thyromegaly present.  Cardiovascular: Normal rate, regular rhythm and intact distal pulses.  Exam reveals no gallop and no friction rub.   No murmur heard. Pulmonary/Chest: No stridor. No respiratory distress. She has no wheezes. She has no rales. She exhibits no tenderness.  Abdominal: Soft. Bowel sounds are normal. She exhibits no distension and no mass. There is no tenderness.  Genitourinary:  Deferred to her gynecologist.  Musculoskeletal: Normal range of motion. She exhibits no  edema or tenderness.  ~10cm  well-healed vertical scar over anterior left knee. Strength 5/5 throughout.  Lymphadenopathy:    She has no cervical adenopathy.  Neurological: She is alert and oriented to person, place, and time. She has normal reflexes.  Skin: Skin is warm and dry. No rash noted. No erythema. No pallor.  Psychiatric: She has a normal mood and affect.   Assessment and Plan :   1. Annual physical exam - Medically healthy, pleasant young female. Immunizations were not found in [facility] database. Patient did get them in [place], she plans on checking [facility] to see if they have her record. Otherwise, discussed healthy lifestyle, diet, exercise, preventative care, vaccinations, and addressed patient's concerns.   2. Seasonal allergies - Advised that she restart Zyrtec, Flonase. Use saline nasal spray to prevent dry skin from Kleenex.  3. Need for Tdap vaccination - Updated today per patient's request.  Wallis BambergMario Cariana Karge, PA-C [facility] [phone] 10/22/2014  2:46 PM

## 2014-10-22 NOTE — Patient Instructions (Signed)

## 2014-10-27 ENCOUNTER — Telehealth: Payer: Self-pay | Admitting: Urgent Care

## 2014-10-27 NOTE — Telephone Encounter (Signed)
Called patient to report results. Wanted to ask her about her sinus issues. Her wbc was elevated and if she still has sinus issues I will send an rx for an antibiotic course.  Otherwise please report that her blood sugar and cholesterol was marginally elevated. I recommend healthy balanced diet and continued hydration and exercise. These can be rechecked at her next annual exam. Thyroid function, electrolytes, kidney function, liver enzymes were all normal.

## 2014-10-29 ENCOUNTER — Encounter: Payer: Self-pay | Admitting: Urgent Care

## 2014-10-29 NOTE — Telephone Encounter (Signed)
Will send letter out
